# Patient Record
Sex: Female | Born: 1995
Health system: Southern US, Community
[De-identification: ages and names within clinical notes are randomized; demographics above are authoritative.]

---
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
## 2018-06-18 ENCOUNTER — Ambulatory Visit: Admit: 2018-06-18 | Disposition: A | Discharge status: Still In Hospital | Payer: Self-pay | Source: Home / Self Care

## 2018-06-18 ENCOUNTER — Encounter: Payer: Self-pay | Admitting: Emergency Medicine

## 2018-06-18 ENCOUNTER — Ambulatory Visit
Admission: EM | Admit: 2018-06-18 | Discharge: 2018-06-18 | Disposition: A | Discharge status: Home / Self Care | Payer: Self-pay | Attending: Family Medicine | Admitting: Family Medicine

## 2018-06-18 DIAGNOSIS — J029 Acute pharyngitis, unspecified: Secondary | ICD-10-CM | POA: Insufficient documentation

## 2018-06-18 LAB — POCT RAPID STREP A (OFFICE): Rapid Strep A Screen: NEGATIVE

## 2018-06-18 NOTE — ED Provider Notes (Signed)
[facility]   [id number] 06/18/18 Arrival Time: 1536  ASSESSMENT & PLAN:  1. Sore throat    Rapid strep negative. Culture sent.  OTC analgesics and throat care as needed.  Follow-up Information    [facility].   Specialty:  Urgent Care Why:  If not improving over the next 3-4 days, sooner if needed. Contact information: [address] [place] [address] [phone]           Discharge Instructions      You may use over the counter ibuprofen or acetaminophen as needed.   For a sore throat, over the counter products such as [employer] Peroxyl Mouth Sore Rinse or Chloraseptic Sore Throat Spray may provide some temporary relief.  Your rapid strep test was negative today. We have sent your throat swab for culture and will let you know of any positive results.   Reviewed expectations re: course of current medical issues. Questions answered. Outlined signs and symptoms indicating need for more acute intervention. Patient verbalized understanding. After Visit Summary given.   SUBJECTIVE:  Janet Russell is a 23 y.o. female who reports a R-sided sore throat. Describes as pain with swallowing. Onset abrupt beginning 4-5 days ago. No respiratory symptoms. Normal PO intake but reports discomfort with swallowing. Fever reported: unsure; questions chills. No significant fatigue. No specific neck pain. No associated n/v/abdominal symptoms. Sick contacts: none known.  OTC treatment: ibuprofen; mild and temporary help.  ROS: As per HPI.   OBJECTIVE:  Vitals:   06/18/18 1555  BP: 115/72  Pulse: 85  Resp: 18  Temp: 97.9 F (36.6 C)  TempSrc: Oral  SpO2: 97%    General appearance: alert; no distress HEENT: throat with mild erythema; no tonsil hypertrophy or exudate; uvula midline: yes Neck: supple with FROM; small R-sided single enlarged lymph node; mild tenderness CV: RRR Lungs: clear to auscultation  bilaterally Abd: soft; non-tender Skin: reveals no rash; warm and dry Psychological: alert and cooperative; normal mood and affect  No Known Allergies  No h/o recurrent strep throat.  Social History   Socioeconomic History  . Marital status: Single    Spouse name: Not on file  . Number of children: Not on file  . Years of education: Not on file  . Highest education level: Not on file  Occupational History  . Not on file  Social Needs  . Financial resource strain: Not on file  . Food insecurity:    Worry: Not on file    Inability: Not on file  . Transportation needs:    Medical: Not on file    Non-medical: Not on file  Tobacco Use  . Smoking status: Never Smoker  . Smokeless tobacco: Never Used  Substance and Sexual Activity  . Alcohol use: Not Currently  . Drug use: Never  . Sexual activity: Not on file  Lifestyle  . Physical activity:    Days per week: Not on file    Minutes per session: Not on file  . Stress: Not on file  Relationships  . Social connections:    Talks on phone: Not on file    Gets together: Not on file    Attends religious service: Not on file    Active member of club or organization: Not on file    Attends meetings of clubs or organizations: Not on file    Relationship status: Not on file  . Intimate partner violence:    Fear of current or ex partner:  Not on file    Emotionally abused: Not on file    Physically abused: Not on file    Forced sexual activity: Not on file  Other Topics Concern  . Not on file  Social History Narrative  . Not on file   History reviewed. No pertinent family history.        Mardella Layman, MD 06/18/18 [id number]

## 2018-06-18 NOTE — ED Triage Notes (Signed)
Pt presents to [facility] for assessment of right ear and jaw pain, worse in the mornings when she wakes up.  Pt states she can't drink hot or cold drinks as well.

## 2018-06-18 NOTE — Discharge Instructions (Addendum)
You may use over the counter ibuprofen or acetaminophen as needed.  For a sore throat, over the counter products such as [employer] Peroxyl Mouth Sore Rinse or Chloraseptic Sore Throat Spray may provide some temporary relief. Your rapid strep test was negative today. We have sent your throat swab for culture and will let you know of any positive results. 

## 2018-06-22 LAB — CULTURE, GROUP A STREP (THRC)
